# Patient Record
Sex: Male | Born: 2000 | Race: White | Hispanic: No | Marital: Single | Smoking: Never smoker
Health system: Southern US, Community
[De-identification: ages and names within clinical notes are randomized; demographics above are authoritative.]

---
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
## 2020-03-21 ENCOUNTER — Ambulatory Visit: Payer: Self-pay | Attending: Internal Medicine

## 2020-03-21 DIAGNOSIS — Z23 Encounter for immunization: Secondary | ICD-10-CM

## 2020-03-21 NOTE — Progress Notes (Signed)
   Covid-19 Vaccination Clinic  Name:  MELVEN STOCKARD    MRN: [id number] DOB: 2001-01-12  03/21/2020  Mr. Bellotti was observed post Covid-19 immunization for 15 minutes without incident. He was provided with Vaccine Information Sheet and instruction to access the V-Safe system.   Mr. Boschee was instructed to call 911 with any severe reactions post vaccine: Marland Kitchen Difficulty breathing  . Swelling of face and throat  . A fast heartbeat  . A bad rash all over body  . Dizziness and weakness   Immunizations Administered    Name Date Dose VIS Date Route   Pfizer COVID-19 Vaccine 03/21/2020  4:00 PM 0.3 mL 01/05/2019 Intramuscular   Manufacturer: [employer], [employer]   Lot: [id number]   NDC: [address]-3

## 2020-04-11 ENCOUNTER — Ambulatory Visit: Payer: Self-pay | Attending: Internal Medicine

## 2020-04-11 DIAGNOSIS — Z23 Encounter for immunization: Secondary | ICD-10-CM

## 2020-04-11 NOTE — Progress Notes (Signed)
   Covid-19 Vaccination Clinic  Name:  Craig Beltran    MRN: [id number] DOB: 2001/09/10  04/11/2020  [name] was observed post Covid-19 immunization for 15 minutes without incident. He was provided with Vaccine Information Sheet and instruction to access the V-Safe system.   [name] was instructed to call 911 with any severe reactions post vaccine: Marland Kitchen Difficulty breathing  . Swelling of face and throat  . A fast heartbeat  . A bad rash all over body  . Dizziness and weakness   Immunizations Administered    Name Date Dose VIS Date Route   Pfizer COVID-19 Vaccine 04/11/2020 10:46 AM 0.3 mL 01/05/2019 Intramuscular   Manufacturer: [employer], [employer]   Lot: [id number]   NDC: [address]-5

## 2023-09-22 ENCOUNTER — Ambulatory Visit
Admission: EM | Admit: 2023-09-22 | Discharge: 2023-09-22 | Disposition: A | Discharge status: Home / Self Care | Payer: Managed Care, Other (non HMO) | Attending: Family Medicine | Admitting: Family Medicine

## 2023-09-22 DIAGNOSIS — B2790 Infectious mononucleosis, unspecified without complication: Secondary | ICD-10-CM

## 2023-09-22 LAB — POCT RAPID STREP A (OFFICE): Rapid Strep A Screen: NEGATIVE

## 2023-09-22 LAB — POCT MONO SCREEN (KUC): Mono, POC: POSITIVE — AB

## 2023-09-22 NOTE — ED Triage Notes (Signed)
Pt reports white spots in the back of his throat and slight throat irritation making it hard to swallow x 2 days

## 2023-09-22 NOTE — ED Provider Notes (Signed)
[facility]    CSN: [id number] Arrival date & time: 09/22/23  1354      History   Chief Complaint Chief Complaint  Patient presents with   Sore Throat    HPI Craig Beltran is a 22 y.o. male.    Sore Throat  Patient is here for sore throat x 2 days, noted white spots on the tonsils.  Mild sinus congestion.  No fevers/chills.  No n/v.  No known strep exposures.        History reviewed. No pertinent past medical history.  There are no problems to display for this patient.   History reviewed. No pertinent surgical history.     Home Medications    Prior to Admission medications   Not on File    Family History History reviewed. No pertinent family history.  Social History Social History   Tobacco Use   Smoking status: Never   Smokeless tobacco: Never  Vaping Use   Vaping status: Never Used  Substance Use Topics   Alcohol use: Never   Drug use: Yes    Types: Marijuana     Allergies   Patient has no known allergies.   Review of Systems Review of Systems  Constitutional: Negative.   HENT:  Positive for sore throat.   Respiratory: Negative.    Cardiovascular: Negative.   Gastrointestinal: Negative.   Musculoskeletal: Negative.   Psychiatric/Behavioral: Negative.       Physical Exam Triage Vital Signs ED Triage Vitals [09/22/23 1420]  Encounter Vitals Group     BP 129/80     Systolic BP Percentile      Diastolic BP Percentile      Pulse Rate 70     Resp 18     Temp 98.8 F (37.1 C)     Temp Source Oral     SpO2 96 %     Weight      Height      Head Circumference      Peak Flow      Pain Score 0     Pain Loc      Pain Education      Exclude from Growth Chart    No data found.  Updated Vital Signs BP 129/80 (BP Location: Left Arm)   Pulse 70   Temp 98.8 F (37.1 C) (Oral)   Resp 18   SpO2 96%   Visual Acuity Right Eye Distance:   Left Eye Distance:   Bilateral Distance:    Right Eye Near:    Left Eye Near:    Bilateral Near:     Physical Exam Constitutional:      Appearance: He is well-developed.  HENT:     Mouth/Throat:     Mouth: Mucous membranes are moist.     Pharynx: Posterior oropharyngeal erythema present. No oropharyngeal exudate.     Tonsils: Tonsillar exudate present. 1+ on the right. 1+ on the left.  Cardiovascular:     Rate and Rhythm: Normal rate and regular rhythm.  Musculoskeletal:     Cervical back: Normal range of motion and neck supple.  Lymphadenopathy:     Cervical: Cervical adenopathy present.  Skin:    General: Skin is warm.  Neurological:     General: No focal deficit present.     Mental Status: He is alert.  Psychiatric:        Mood and Affect: Mood normal.      UC Treatments / Results  Labs (all labs  ordered are listed, but only abnormal results are displayed) Labs Reviewed  POCT MONO SCREEN ([facility]) - Abnormal; Notable for the following components:      Result Value   Mono, POC Positive (*)    All other components within normal limits  POCT RAPID STREP A (OFFICE)    EKG   Radiology No results found.  Procedures Procedures (including critical care time)  Medications Ordered in UC Medications - No data to display  Initial Impression / Assessment and Plan / UC Course  I have reviewed the triage vital signs and the nursing notes.  Pertinent labs & imaging results that were available during my care of the patient were reviewed by me and considered in my medical decision making (see chart for details).   Final Clinical Impressions(s) / UC Diagnoses   Final diagnoses:  Infectious mononucleosis without complication, infectious mononucleosis due to unspecified organism     Discharge Instructions      You were diagnosed with mono today.  I recommend you get rest and fluids.  Take tylenol or motrin for pain, and salt water gargles.  This is viral and will resolve over time.  Avoid contact sports x 10 days.     ED  Prescriptions   None    PDMP not reviewed this encounter.   Jannifer Franklin, MD 09/22/23 [phone]

## 2023-09-22 NOTE — Discharge Instructions (Addendum)
You were diagnosed with mono today.  I recommend you get rest and fluids.  Take tylenol or motrin for pain, and salt water gargles.  This is viral and will resolve over time.  Avoid contact sports x 10 days.
